# Patient Record
Sex: Female | Born: 1995 | Race: Black or African American | Hispanic: No | Marital: Single | Smoking: Current some day smoker
Health system: Southern US, Community
[De-identification: ages and names within clinical notes are randomized; demographics above are authoritative.]

## PROBLEM LIST (undated history)

## (undated) HISTORY — PX: ADENOIDECTOMY: SUR15

## (undated) HISTORY — PX: TONSILLECTOMY: SUR1361

---

## 2016-08-17 ENCOUNTER — Emergency Department (HOSPITAL_COMMUNITY): Payer: Medicaid Other

## 2016-08-17 ENCOUNTER — Emergency Department (HOSPITAL_COMMUNITY)
Admission: EM | Admit: 2016-08-17 | Discharge: 2016-08-17 | Disposition: A | Discharge status: Home / Self Care | Payer: Medicaid Other | Attending: Emergency Medicine | Admitting: Emergency Medicine

## 2016-08-17 ENCOUNTER — Encounter (HOSPITAL_COMMUNITY): Payer: Self-pay | Admitting: Emergency Medicine

## 2016-08-17 DIAGNOSIS — R6889 Other general symptoms and signs: Secondary | ICD-10-CM

## 2016-08-17 DIAGNOSIS — Z79899 Other long term (current) drug therapy: Secondary | ICD-10-CM | POA: Diagnosis not present

## 2016-08-17 DIAGNOSIS — R52 Pain, unspecified: Secondary | ICD-10-CM | POA: Diagnosis not present

## 2016-08-17 DIAGNOSIS — R05 Cough: Secondary | ICD-10-CM | POA: Insufficient documentation

## 2016-08-17 DIAGNOSIS — F17210 Nicotine dependence, cigarettes, uncomplicated: Secondary | ICD-10-CM | POA: Insufficient documentation

## 2016-08-17 DIAGNOSIS — R509 Fever, unspecified: Secondary | ICD-10-CM | POA: Insufficient documentation

## 2016-08-17 LAB — CBC
HEMATOCRIT: 36.7 % (ref 36.0–46.0)
HEMOGLOBIN: 12.5 g/dL (ref 12.0–15.0)
MCH: 28.3 pg (ref 26.0–34.0)
MCHC: 34.1 g/dL (ref 30.0–36.0)
MCV: 83.2 fL (ref 78.0–100.0)
Platelets: 191 10*3/uL (ref 150–400)
RBC: 4.41 MIL/uL (ref 3.87–5.11)
RDW: 12.8 % (ref 11.5–15.5)
WBC: 5.4 10*3/uL (ref 4.0–10.5)

## 2016-08-17 LAB — COMPREHENSIVE METABOLIC PANEL
ALBUMIN: 4.1 g/dL (ref 3.5–5.0)
ALT: 21 U/L (ref 14–54)
ANION GAP: 10 (ref 5–15)
AST: 32 U/L (ref 15–41)
Alkaline Phosphatase: 55 U/L (ref 38–126)
BUN: 12 mg/dL (ref 6–20)
CO2: 20 mmol/L — AB (ref 22–32)
Calcium: 8.9 mg/dL (ref 8.9–10.3)
Chloride: 107 mmol/L (ref 101–111)
Creatinine, Ser: 0.89 mg/dL (ref 0.44–1.00)
GFR calc Af Amer: >60 mL/min (ref >60)
GFR calc non Af Amer: >60 mL/min (ref >60)
GLUCOSE: 85 mg/dL (ref 65–99)
POTASSIUM: 2.9 mmol/L — AB (ref 3.5–5.1)
SODIUM: 137 mmol/L (ref 135–145)
TOTAL PROTEIN: 7.3 g/dL (ref 6.5–8.1)
Total Bilirubin: 0.7 mg/dL (ref 0.3–1.2)

## 2016-08-17 LAB — URINALYSIS, ROUTINE W REFLEX MICROSCOPIC
BILIRUBIN URINE: NEGATIVE
Glucose, UA: NEGATIVE mg/dL
HGB URINE DIPSTICK: NEGATIVE
KETONES UR: 80 mg/dL — AB
Leukocytes, UA: NEGATIVE
NITRITE: NEGATIVE
PROTEIN: 30 mg/dL — AB
Specific Gravity, Urine: 1.031 — ABNORMAL HIGH (ref 1.005–1.030)
pH: 5 (ref 5.0–8.0)

## 2016-08-17 LAB — PREGNANCY, URINE: Preg Test, Ur: NEGATIVE

## 2016-08-17 LAB — LIPASE, BLOOD: Lipase: 31 U/L (ref 11–51)

## 2016-08-17 LAB — I-STAT BETA HCG BLOOD, ED (MC, WL, AP ONLY)

## 2016-08-17 MED ORDER — IBUPROFEN 800 MG PO TABS: 800.0000 mg | ORAL_TABLET | Freq: Three times a day (TID) | ORAL | 0 refills | Status: DC

## 2016-08-17 MED ORDER — POTASSIUM CHLORIDE CRYS ER 20 MEQ PO TBCR
60.0000 meq | EXTENDED_RELEASE_TABLET | Freq: Once | ORAL | Status: AC
  Administered 2016-08-17: 60 meq via ORAL
  Filled 2016-08-17: qty 3

## 2016-08-17 MED ORDER — SODIUM CHLORIDE 0.9 % IV BOLUS (SEPSIS)
1000.0000 mL | Freq: Once | INTRAVENOUS | Status: AC
  Administered 2016-08-17: 1000 mL via INTRAVENOUS

## 2016-08-17 MED ORDER — ONDANSETRON HCL 4 MG PO TABS: 4.0000 mg | ORAL_TABLET | Freq: Four times a day (QID) | ORAL | 0 refills | Status: DC

## 2016-08-17 MED ORDER — ONDANSETRON HCL 4 MG/2ML IJ SOLN
4.0000 mg | Freq: Once | INTRAMUSCULAR | Status: AC
  Administered 2016-08-17: 4 mg via INTRAVENOUS
  Filled 2016-08-17: qty 2

## 2016-08-17 MED ORDER — CETIRIZINE-PSEUDOEPHEDRINE ER 5-120 MG PO TB12: 1.0000 | ORAL_TABLET | Freq: Two times a day (BID) | ORAL | 0 refills | Status: DC

## 2016-08-17 MED ORDER — BENZONATATE 100 MG PO CAPS: 100.0000 mg | ORAL_CAPSULE | Freq: Three times a day (TID) | ORAL | 0 refills | Status: DC

## 2016-08-17 NOTE — ED Notes (Signed)
Pt drinking water for PO challenge

## 2016-08-17 NOTE — ED Triage Notes (Addendum)
Pt verbalizes continued flu like symptoms of chills, fever, chest pain with cough, nausea, diarrhea, and generalized aches for two weeks. Pt denies vomiting.

## 2016-08-17 NOTE — Discharge Instructions (Signed)
Medications: Tessalon, Zyrtec-D, Zofran, ibuprofen  Treatment: Take Tessalon every 8 hours as needed for cough. Take Zyrtec-D twice daily for nasal congestion. Take Zofran every 6 hours as needed for nausea or vomiting. Take ibuprofen every 8 hours for body aches. You can alternate with Tylenol as prescribed over-the-counter for fever and pain as well. Make sure to drink plenty of fluids.  Follow-up: Please return the emergency department if you develop any new or worsening symptoms including feeling like you are going to pass out, intractable vomiting, or any other concerning symptoms.

## 2016-08-17 NOTE — ED Provider Notes (Signed)
[facility]-EMERGENCY DEPT Provider Note   CSN: [id number] Arrival date & time: 08/17/16  1517     History   Chief Complaint Chief Complaint  Patient presents with  . Flu Like Symptoms    HPI Wanda Bryan is a 21 y.o. female who has previously healthy who presents with a four-day history of fever, cough, generalized body aches, diarrhea. Patient states she began 2 weeks ago with a upper respiratory infection and began feeling better, until 4 days ago when she began having fever, chills, nausea, generalized body aches, diarrhea. No bloody stools or vomiting. No abdominal pain. Patient states she has an achy and burning pain in her chest. No shortness of breath. Patient has been taking TheraFlu without significant relief. Patient has been able to drink fluids, but is unable to eat due to nausea. Patient denies any urinary symptoms, no leg pain or swelling, recent long trips, recent surgeries, cancer. Patient uses Mirena for birth control.  HPI  History reviewed. No pertinent past medical history.  There are no active problems to display for this patient.   History reviewed. No pertinent surgical history.  OB History    No data available       Home Medications    Prior to Admission medications   Medication Sig Start Date End Date Taking? Authorizing Provider  benzonatate (TESSALON) 100 MG capsule Take 1 capsule (100 mg total) by mouth every 8 (eight) hours. 08/17/16   Carnetta Losada M Shadara Lopez, PA-C  cetirizine-pseudoephedrine (ZYRTEC-D) 5-120 MG tablet Take 1 tablet by mouth 2 (two) times daily. 08/17/16   Emi HolesAlexandra M [place] Paone, PA-C  ibuprofen (ADVIL,MOTRIN) 800 MG tablet Take 1 tablet (800 mg total) by mouth 3 (three) times daily. 08/17/16   Shanequa Whitenight M Maalle Starrett, PA-C  ondansetron (ZOFRAN) 4 MG tablet Take 1 tablet (4 mg total) by mouth every 6 (six) hours. 08/17/16   Emi HolesAlexandra M Tharon Bomar, PA-C    Family History No family history on file.  Social History Social History  Substance Use Topics  . Smoking  status: Current Some Day Smoker    Packs/day: 0.25    Types: Cigarettes  . Smokeless tobacco: Never Used  . Alcohol use No     Allergies   Penicillins   Review of Systems Review of Systems  Constitutional: Positive for appetite change, chills and fever.  HENT: Positive for congestion and sore throat. Negative for facial swelling.   Respiratory: Positive for cough (productive). Negative for shortness of breath.   Cardiovascular: Positive for chest pain.  Gastrointestinal: Positive for diarrhea and nausea. Negative for abdominal pain, blood in stool and vomiting.  Genitourinary: Negative for dysuria.  Musculoskeletal: Positive for myalgias. Negative for back pain.  Skin: Negative for rash and wound.  Neurological: Negative for headaches.  Psychiatric/Behavioral: The patient is not nervous/anxious.      Physical Exam Updated Vital Signs BP 113/57   Pulse 84   Temp 99.8 F (37.7 C) (Oral)   Resp 20   Wt 90.7 kg   LMP 08/16/2016   SpO2 99%   Physical Exam  Constitutional: She appears well-developed and well-nourished. No distress.  HENT:  Head: Normocephalic and atraumatic.  Mouth/Throat: Oropharynx is clear and moist. No oropharyngeal exudate.  Eyes: Conjunctivae are normal. Pupils are equal, round, and reactive to light. Right eye exhibits no discharge. Left eye exhibits no discharge. No scleral icterus.  Neck: Normal range of motion. Neck supple. No thyromegaly present.  Cardiovascular: Normal rate, regular rhythm, normal heart sounds and intact distal pulses.  Exam reveals no gallop and no friction rub.   No murmur heard. Pulmonary/Chest: Effort normal. No stridor. No respiratory distress. She has no wheezes. She has rales (subtle at bases).  Abdominal: Soft. Bowel sounds are normal. She exhibits no distension. There is no tenderness. There is no rebound and no guarding.  Musculoskeletal: She exhibits no edema.  Lymphadenopathy:    She has no cervical adenopathy.    Neurological: She is alert. Coordination normal.  Skin: Skin is warm and dry. No rash noted. She is not diaphoretic. No pallor.  Psychiatric: She has a normal mood and affect.  Nursing note and vitals reviewed.    ED Treatments / Results  Labs (all labs ordered are listed, but only abnormal results are displayed) Labs Reviewed  COMPREHENSIVE METABOLIC PANEL - Abnormal; Notable for the following:       Result Value   Potassium 2.9 (*)    CO2 20 (*)    All other components within normal limits  URINALYSIS, ROUTINE W REFLEX MICROSCOPIC - Abnormal; Notable for the following:    Specific Gravity, Urine 1.031 (*)    Ketones, ur 80 (*)    Protein, ur 30 (*)    Bacteria, UA RARE (*)    Squamous Epithelial / LPF 0-5 (*)    All other components within normal limits  LIPASE, BLOOD  CBC  PREGNANCY, URINE  I-STAT BETA HCG BLOOD, ED ([facility], [facility], [facility] ONLY)    EKG  EKG Interpretation  Date/Time:  Saturday August 17 2016 17:18:11 EST Ventricular Rate:  89 PR Interval:    QRS Duration: 85 QT Interval:  341 QTC Calculation: 415 R Axis:   80 Text Interpretation:  Sinus rhythm Normal ECG Confirmed by Denton Lank  MD, Caryn Bee ([id number]) on 08/17/2016 6:26:13 PM       Radiology Dg Chest 2 View  Result Date: 08/17/2016 CLINICAL DATA:  Cough x 4 days; no known cardiopulmonary problems; ex smoker EXAM: CHEST  2 VIEW COMPARISON:  None. FINDINGS: Lateral view degraded by patient arm position. Midline trachea. Normal heart size and mediastinal contours. No pleural effusion or pneumothorax. Clear lungs. IMPRESSION: No active cardiopulmonary disease. Electronically Signed   By: Jeronimo Greaves M.D.   On: 08/17/2016 17:31    Procedures Procedures (including critical care time)  Medications Ordered in ED Medications  sodium chloride 0.9 % bolus 1,000 mL (0 mLs Intravenous Stopped 08/17/16 1830)  ondansetron (ZOFRAN) injection 4 mg (4 mg Intravenous Given 08/17/16 1830)  potassium chloride SA (K-DUR,KLOR-CON) CR  tablet 60 mEq (60 mEq Oral Given 08/17/16 1825)  sodium chloride 0.9 % bolus 1,000 mL (1,000 mLs Intravenous New Bag/Given 08/17/16 1830)     Initial Impression / Assessment and Plan / ED Course  I have reviewed the triage vital signs and the nursing notes.  Pertinent labs & imaging results that were available during my care of the patient were reviewed by me and considered in my medical decision making (see chart for details).     CBC unremarkable. CMP shows potassium 2.9, replaced in the ED. Lipase 31. Pregnancy negative. UA shows 80 ketones, 30 protein, specific gravity 1.031. EKG shows NSR. CXR shows no active cardiopulmonary disease. Patient given 2 L of fluid in the ED. Vitals stable. Symptoms are consistent with flu. Patient out of the window for Tamiflu treatment. Will treat symptomatically with cough suppressant, Zyrtec-D, Motrin/Tylenol, Zofran. I encouraged good fluid intake. Return precautions discussed. Patient understands and agrees with plan. Patient vitals stable throughout ED course and discharged  in satisfactory condition.  Final Clinical Impressions(s) / ED Diagnoses   Final diagnoses:  Influenza-like symptoms    New Prescriptions New Prescriptions   BENZONATATE (TESSALON) 100 MG CAPSULE    Take 1 capsule (100 mg total) by mouth every 8 (eight) hours.   CETIRIZINE-PSEUDOEPHEDRINE (ZYRTEC-D) 5-120 MG TABLET    Take 1 tablet by mouth 2 (two) times daily.   IBUPROFEN (ADVIL,MOTRIN) 800 MG TABLET    Take 1 tablet (800 mg total) by mouth 3 (three) times daily.   ONDANSETRON (ZOFRAN) 4 MG TABLET    Take 1 tablet (4 mg total) by mouth every 6 (six) hours.     Emi Holes, PA-C 08/17/16 1955    Linwood Dibbles, MD 08/18/16 [phone]

## 2017-04-26 ENCOUNTER — Emergency Department (HOSPITAL_COMMUNITY)
Admission: EM | Admit: 2017-04-26 | Discharge: 2017-04-27 | Disposition: A | Discharge status: Home / Self Care | Payer: Self-pay | Attending: Emergency Medicine | Admitting: Emergency Medicine

## 2017-04-26 ENCOUNTER — Encounter (HOSPITAL_COMMUNITY): Payer: Self-pay

## 2017-04-26 DIAGNOSIS — R102 Pelvic and perineal pain: Secondary | ICD-10-CM

## 2017-04-26 DIAGNOSIS — N390 Urinary tract infection, site not specified: Secondary | ICD-10-CM | POA: Insufficient documentation

## 2017-04-26 DIAGNOSIS — J302 Other seasonal allergic rhinitis: Secondary | ICD-10-CM | POA: Insufficient documentation

## 2017-04-26 DIAGNOSIS — F17210 Nicotine dependence, cigarettes, uncomplicated: Secondary | ICD-10-CM | POA: Insufficient documentation

## 2017-04-26 LAB — URINALYSIS, ROUTINE W REFLEX MICROSCOPIC
Bilirubin Urine: NEGATIVE
Glucose, UA: NEGATIVE mg/dL
Ketones, ur: NEGATIVE mg/dL
Nitrite: NEGATIVE
Protein, ur: 100 mg/dL — AB
Specific Gravity, Urine: 1.02 (ref 1.005–1.030)
pH: 8 (ref 5.0–8.0)

## 2017-04-26 LAB — COMPREHENSIVE METABOLIC PANEL
ALT: 14 U/L (ref 14–54)
AST: 21 U/L (ref 15–41)
Albumin: 4.3 g/dL (ref 3.5–5.0)
Alkaline Phosphatase: 75 U/L (ref 38–126)
Anion gap: 8 (ref 5–15)
BUN: 11 mg/dL (ref 6–20)
CO2: 24 mmol/L (ref 22–32)
Calcium: 9.8 mg/dL (ref 8.9–10.3)
Chloride: 111 mmol/L (ref 101–111)
Creatinine, Ser: 0.99 mg/dL (ref 0.44–1.00)
GFR calc Af Amer: >60 mL/min (ref >60)
GFR calc non Af Amer: >60 mL/min (ref >60)
Glucose, Bld: 114 mg/dL — ABNORMAL HIGH (ref 65–99)
Potassium: 4.3 mmol/L (ref 3.5–5.1)
Sodium: 143 mmol/L (ref 135–145)
Total Bilirubin: 0.4 mg/dL (ref 0.3–1.2)
Total Protein: 7.5 g/dL (ref 6.5–8.1)

## 2017-04-26 LAB — LIPASE, BLOOD: Lipase: 34 U/L (ref 11–51)

## 2017-04-26 LAB — CBC
HCT: 39.5 % (ref 36.0–46.0)
Hemoglobin: 13.2 g/dL (ref 12.0–15.0)
MCH: 28.3 pg (ref 26.0–34.0)
MCHC: 33.4 g/dL (ref 30.0–36.0)
MCV: 84.6 fL (ref 78.0–100.0)
Platelets: 237 10*3/uL (ref 150–400)
RBC: 4.67 MIL/uL (ref 3.87–5.11)
RDW: 13.5 % (ref 11.5–15.5)
WBC: 5.6 10*3/uL (ref 4.0–10.5)

## 2017-04-26 LAB — POC URINE PREG, ED: Preg Test, Ur: NEGATIVE

## 2017-04-26 NOTE — ED Triage Notes (Signed)
States for about 2 days now lower abdominal pain with dysuria with diarrhea.  bil ear pain states ears are popping.

## 2017-04-27 MED ORDER — SULFAMETHOXAZOLE-TRIMETHOPRIM 800-160 MG PO TABS: 1.0000 | ORAL_TABLET | Freq: Two times a day (BID) | ORAL | 0 refills | Status: AC

## 2017-04-27 MED ORDER — FLUCONAZOLE 200 MG PO TABS: 200.0000 mg | ORAL_TABLET | ORAL | 0 refills | Status: AC

## 2017-04-27 MED ORDER — CETIRIZINE HCL 10 MG PO TABS: 10.0000 mg | ORAL_TABLET | Freq: Every day | ORAL | 1 refills | Status: AC

## 2017-04-27 MED ORDER — FLUTICASONE PROPIONATE 50 MCG/ACT NA SUSP: 2.0000 | Freq: Every day | NASAL | 2 refills | Status: DC

## 2017-04-27 MED ORDER — PHENAZOPYRIDINE HCL 200 MG PO TABS: 200.0000 mg | ORAL_TABLET | Freq: Three times a day (TID) | ORAL | 0 refills | Status: DC | PRN

## 2017-04-27 NOTE — ED Provider Notes (Signed)
[facility]-EMERGENCY DEPT Provider Note   CSN: [id number] Arrival date & time: 04/26/17  1842     History   Chief Complaint Chief Complaint  Patient presents with  . Abdominal Pain    HPI Wanda Bryan is a 21 y.o. female with no major medical problems presents to the Emergency Department complaining of gradual, persistent, progressively worsening lower abdominal pain, dysuria, urinary frequency and urgency onset 2 days ago.  Patient reports she is sexually active with one female partner onset approximately 2 months ago.  She denies vaginal discharge.  Patient also denies risk of STDs.  She reports a previous urinary tract infection several months ago however the urgent care "did not give her the right antibiotic" because she did not get better.  Patient does report that her symptoms did resolve within approximately 1 week.  Patient also reports that she developed yeast infection secondary to her antibiotic usage at that time.  Additionally patient is complaining of nasal congestion, clear rhinorrhea, postnasal drip, sneezing and bilateral otalgia.  No treatments prior to arrival.  She denies fevers, chills, cough.  Patient denies known sick contacts.  She has taken over-the-counter cold medication with minimal relief.  She has not attempted any allergy medications.  She denies vision changes, sinus pain.   The history is provided by the patient and medical records. No language interpreter was used.    History reviewed. No pertinent past medical history.  There are no active problems to display for this patient.   History reviewed. No pertinent surgical history.  OB History    No data available       Home Medications    Prior to Admission medications   Medication Sig Start Date End Date Taking? Authorizing Provider  benzonatate (TESSALON) 100 MG capsule Take 1 capsule (100 mg total) by mouth every 8 (eight) hours. 08/17/16   Law, Waylan Boga, PA-C    cetirizine (ZYRTEC ALLERGY) 10 MG tablet Take 1 tablet (10 mg total) daily by mouth. 04/27/17   Domini Vandehei, Dahlia Client, PA-C  cetirizine-pseudoephedrine (ZYRTEC-D) 5-120 MG tablet Take 1 tablet by mouth 2 (two) times daily. 08/17/16   Law, Waylan Boga, PA-C  fluconazole (DIFLUCAN) 200 MG tablet Take 1 tablet (200 mg total) every 3 (three) days for 2 doses by mouth. 04/27/17 05/01/17  Dulcie Gammon, Dahlia Client, PA-C  fluticasone (FLONASE) 50 MCG/ACT nasal spray Place 2 sprays daily into both nostrils. 04/27/17   Derionna Salvador, Dahlia Client, PA-C  ibuprofen (ADVIL,MOTRIN) 800 MG tablet Take 1 tablet (800 mg total) by mouth 3 (three) times daily. 08/17/16   Law, Waylan Boga, PA-C  ondansetron (ZOFRAN) 4 MG tablet Take 1 tablet (4 mg total) by mouth every 6 (six) hours. 08/17/16   Law, Waylan Boga, PA-C  phenazopyridine (PYRIDIUM) 200 MG tablet Take 1 tablet (200 mg total) 3 (three) times daily as needed by mouth for pain. 04/27/17   Innocence Schlotzhauer, Dahlia Client, PA-C  sulfamethoxazole-trimethoprim (BACTRIM DS,SEPTRA DS) 800-160 MG tablet Take 1 tablet 2 (two) times daily for 7 days by mouth. 04/27/17 05/04/17  Gessica Jawad, Dahlia Client, PA-C    Family History History reviewed. No pertinent family history.  Social History Social History   Tobacco Use  . Smoking status: Current Some Day Smoker    Packs/day: 0.25    Types: Cigarettes  . Smokeless tobacco: Never Used  Substance Use Topics  . Alcohol use: No  . Drug use: No     Allergies   Penicillins   Review of Systems Review of Systems  Constitutional:  Negative for appetite change, diaphoresis, fatigue, fever and unexpected weight change.  HENT: Positive for congestion, ear pain, postnasal drip, rhinorrhea and sore throat. Negative for mouth sores, sinus pressure and sinus pain.   Eyes: Negative for visual disturbance.  Respiratory: Negative for cough, chest tightness, shortness of breath and wheezing.   Cardiovascular: Negative for chest pain.  Gastrointestinal:  Positive for abdominal pain ( Suprapubic). Negative for constipation, diarrhea, nausea and vomiting.  Endocrine: Negative for polydipsia, polyphagia and polyuria.  Genitourinary: Positive for dysuria, frequency and urgency. Negative for hematuria, vaginal bleeding, vaginal discharge and vaginal pain.  Musculoskeletal: Negative for back pain and neck stiffness.  Skin: Negative for rash.  Allergic/Immunologic: Negative for immunocompromised state.  Neurological: Negative for syncope, light-headedness and headaches.  Hematological: Does not bruise/bleed easily.  Psychiatric/Behavioral: Negative for sleep disturbance. The patient is not nervous/anxious.      Physical Exam Updated Vital Signs BP 139/68 (BP Location: Right Arm)   Pulse 70   Temp 98.5 F (36.9 C) (Oral)   Resp 18   Ht 5\' 3"  (1.6 m)   Wt 90.7 kg (200 lb)   SpO2 100%   BMI 35.43 kg/m   Physical Exam  Constitutional: She appears well-developed and well-nourished. No distress.  Awake, alert, nontoxic appearance  HENT:  Head: Normocephalic and atraumatic.  Right Ear: Tympanic membrane, external ear and ear canal normal.  Left Ear: Tympanic membrane, external ear and ear canal normal.  Nose: Mucosal edema and rhinorrhea present. No epistaxis. Right sinus exhibits no maxillary sinus tenderness and no frontal sinus tenderness. Left sinus exhibits no maxillary sinus tenderness and no frontal sinus tenderness.  Mouth/Throat: Uvula is midline, oropharynx is clear and moist and mucous membranes are normal. Mucous membranes are not pale and not cyanotic. No oropharyngeal exudate, posterior oropharyngeal edema, posterior oropharyngeal erythema or tonsillar abscesses.  Eyes: Conjunctivae are normal. Pupils are equal, round, and reactive to light. No scleral icterus.  Neck: Normal range of motion and full passive range of motion without pain. Neck supple.  Cardiovascular: Normal rate, regular rhythm and intact distal pulses.    Pulmonary/Chest: Effort normal and breath sounds normal. No stridor. No respiratory distress. She has no wheezes.  Clear and equal breath sounds without focal wheezes, rhonchi, rales  Abdominal: Soft. Bowel sounds are normal. She exhibits no mass. There is tenderness ( Very mild) in the suprapubic area. There is no rigidity, no rebound, no guarding, no CVA tenderness, no tenderness at McBurney's point and negative Murphy's sign.  Musculoskeletal: Normal range of motion. She exhibits no edema.  Lymphadenopathy:    She has no cervical adenopathy.  Neurological: She is alert.  Speech is clear and goal oriented Moves extremities without ataxia  Skin: Skin is warm and dry. No rash noted. She is not diaphoretic.  Psychiatric: She has a normal mood and affect.  Nursing note and vitals reviewed.    ED Treatments / Results  Labs (all labs ordered are listed, but only abnormal results are displayed) Labs Reviewed  COMPREHENSIVE METABOLIC PANEL - Abnormal; Notable for the following components:      Result Value   Glucose, Bld 114 (*)    All other components within normal limits  URINALYSIS, ROUTINE W REFLEX MICROSCOPIC - Abnormal; Notable for the following components:   APPearance CLOUDY (*)    Hgb urine dipstick MODERATE (*)    Protein, ur 100 (*)    Leukocytes, UA LARGE (*)    Bacteria, UA RARE (*)    Squamous  Epithelial / LPF 0-5 (*)    All other components within normal limits  URINE CULTURE  LIPASE, BLOOD  CBC  POC URINE PREG, ED     Procedures Procedures (including critical care time)  Medications Ordered in ED Medications - No data to display   Initial Impression / Assessment and Plan / ED Course  I have reviewed the triage vital signs and the nursing notes.  Pertinent labs & imaging results that were available during my care of the patient were reviewed by me and considered in my medical decision making (see chart for details).     Pt has been diagnosed with a UTI.  Pt is afebrile, no CVA tenderness, normotensive, and denies N/V. Pt to be dc home with antibiotics and instructions to follow up with PCP if symptoms persist.  Due to previous urinary tract infection, culture was sent.  Because concern about the potential STDs from a new sexual partner and contaminated urine however less likely.  Patient reports she will see her primary care doctor for STD evaluation.  She does not wish for pelvic exam or STD testing today.  Discussed reasons to return immediately to the emergency department including worsening pain, vomiting, back pain, fevers or other concerns.  Patient states understanding and is in agreement with the plan.  Patient upper respiratory symptoms likely secondary to seasonal allergies.  Will give Flonase and Zyrtec.  No evidence of pneumonia or acute sinusitis.  Discussed that antibiotics are not indicated at this time. Pt will be discharged with symptomatic treatment.  Pt is hemodynamically stable & in NAD prior to dc.    Final Clinical Impressions(s) / ED Diagnoses   Final diagnoses:  Lower urinary tract infectious disease  Suprapubic abdominal pain  Seasonal allergic rhinitis, unspecified trigger    ED Discharge Orders        Ordered    fluconazole (DIFLUCAN) 200 MG tablet  every 72 hours     04/27/17 0008    sulfamethoxazole-trimethoprim (BACTRIM DS,SEPTRA DS) 800-160 MG tablet  2 times daily     04/27/17 0008    fluticasone (FLONASE) 50 MCG/ACT nasal spray  Daily     04/27/17 0008    cetirizine (ZYRTEC ALLERGY) 10 MG tablet  Daily     04/27/17 0008    phenazopyridine (PYRIDIUM) 200 MG tablet  3 times daily PRN     04/27/17 0009       Tyshia Fenter, Dahlia ClientHannah, PA-C 04/27/17 0017    Raeford RazorKohut, Stephen, MD 04/29/17 1238

## 2017-04-27 NOTE — Discharge Instructions (Signed)
1. Medications: Bactrim, Pyridium, Flonase, Zyrtec, fluconazole, usual home medications 2. Treatment: rest, drink plenty of fluids, take medications as prescribed 3. Follow Up: Please followup with your primary doctor in 3 days for discussion of your diagnoses and further evaluation after today's visit; if you do not have a primary care doctor use the resource guide provided to find one; return to the ER for fevers, persistent vomiting, worsening abdominal pain or other concerning symptoms.

## 2018-04-28 ENCOUNTER — Other Ambulatory Visit: Payer: Self-pay | Admitting: Family Medicine

## 2018-04-28 ENCOUNTER — Ambulatory Visit: Payer: Self-pay

## 2018-04-28 DIAGNOSIS — M542 Cervicalgia: Secondary | ICD-10-CM

## 2019-04-20 IMAGING — DX DG CERVICAL SPINE COMPLETE 4+V
5 series · 5 of 5 positions shown · non-contrast
Comparison: None.

CLINICAL DATA: Neck pain

EXAM:
CERVICAL SPINE - COMPLETE 4+ VIEW

[c-spine lat]
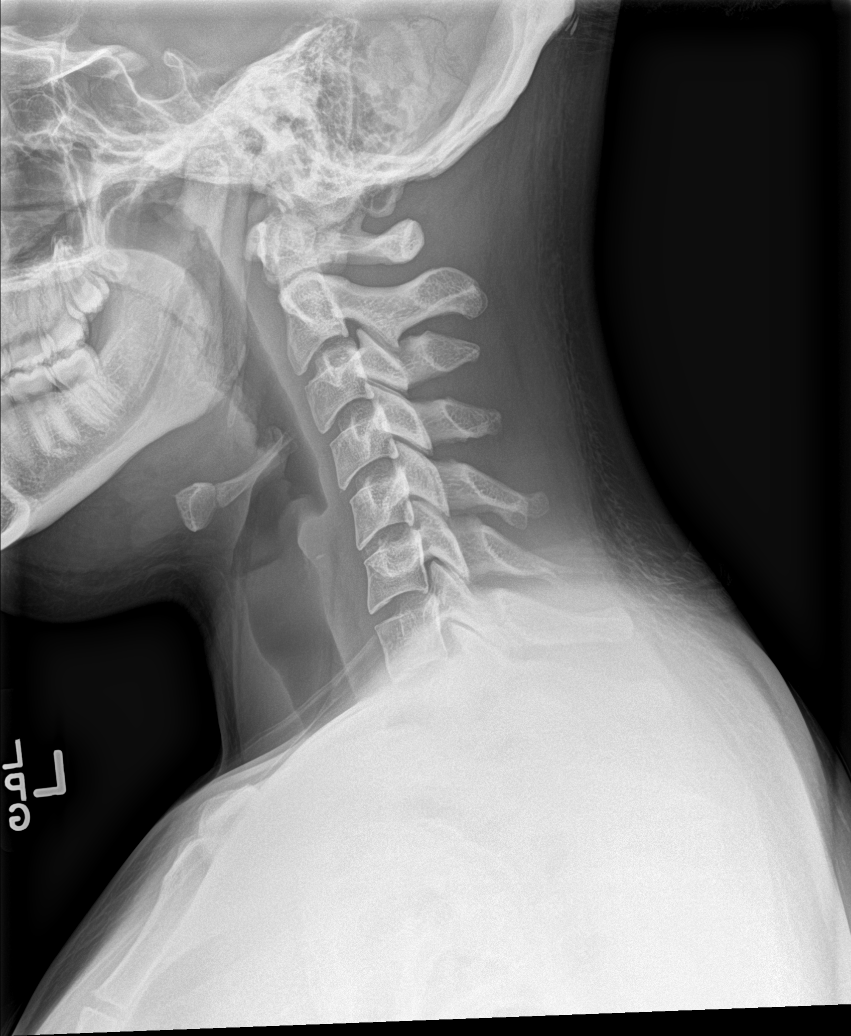

[c-spine obl (1 of 2)]
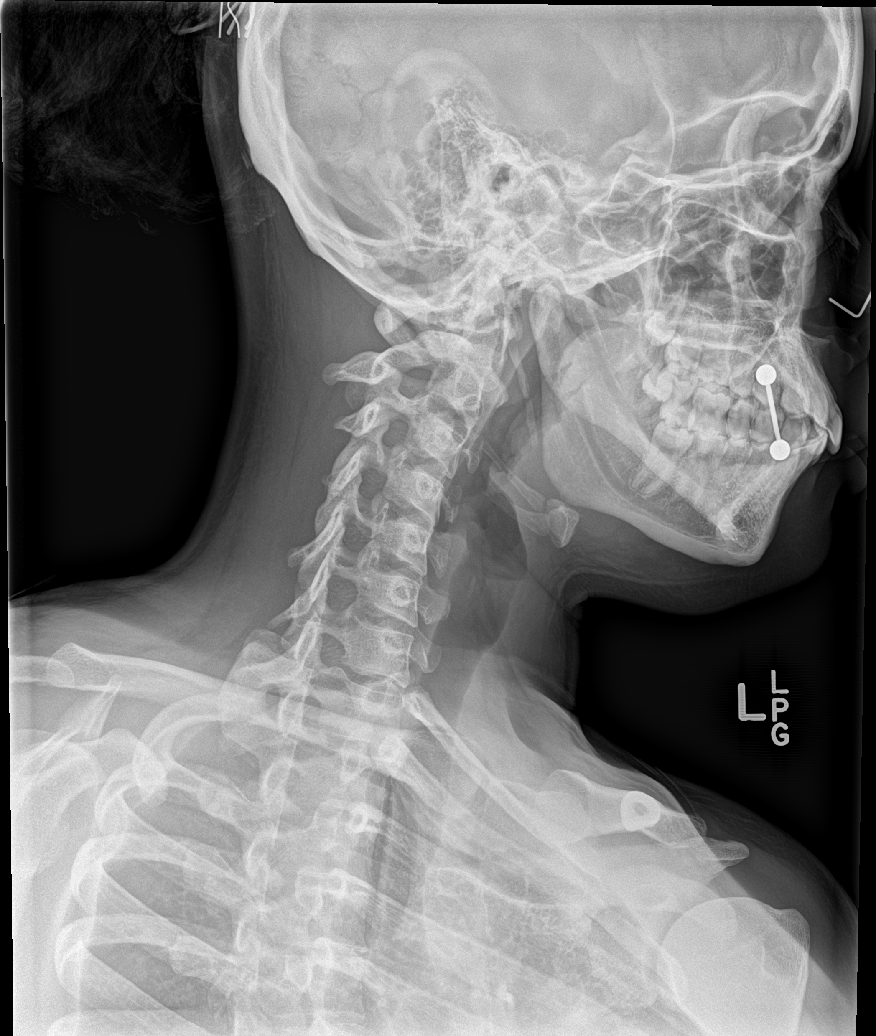

[c-spine obl (2 of 2)]
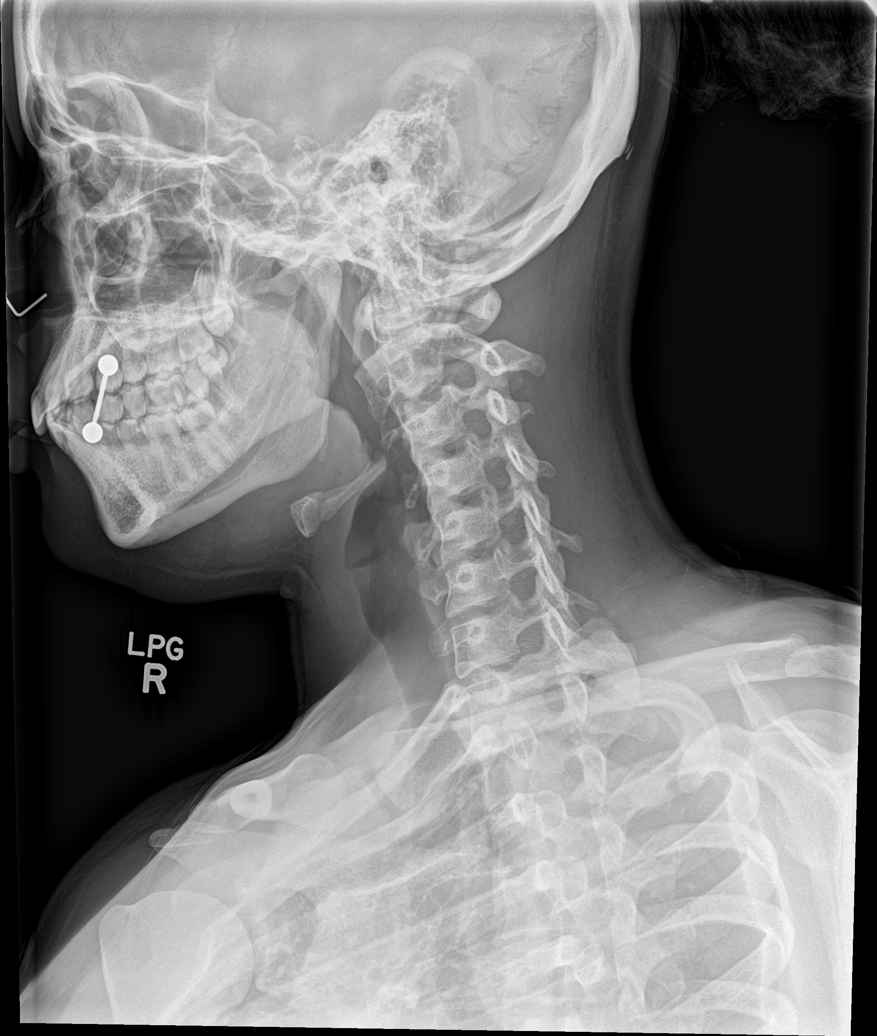

[c-spine ap]
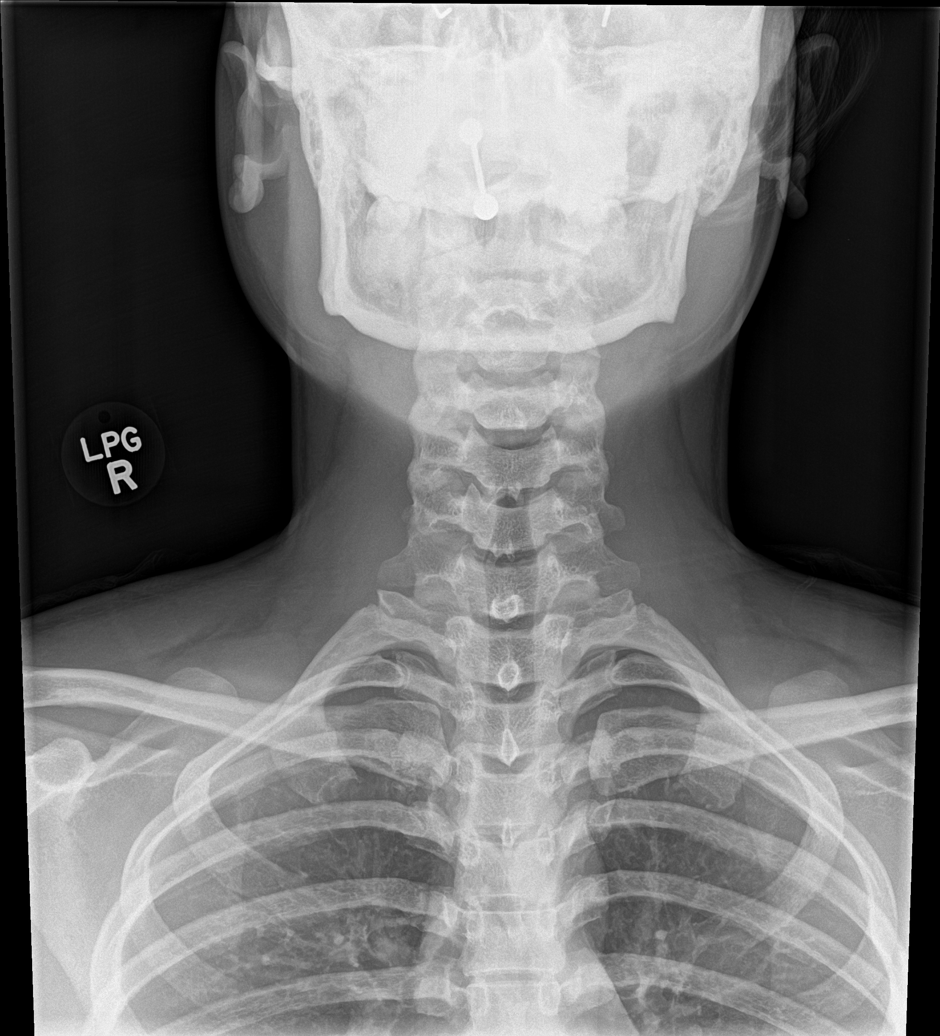

[c-spine open mouth]
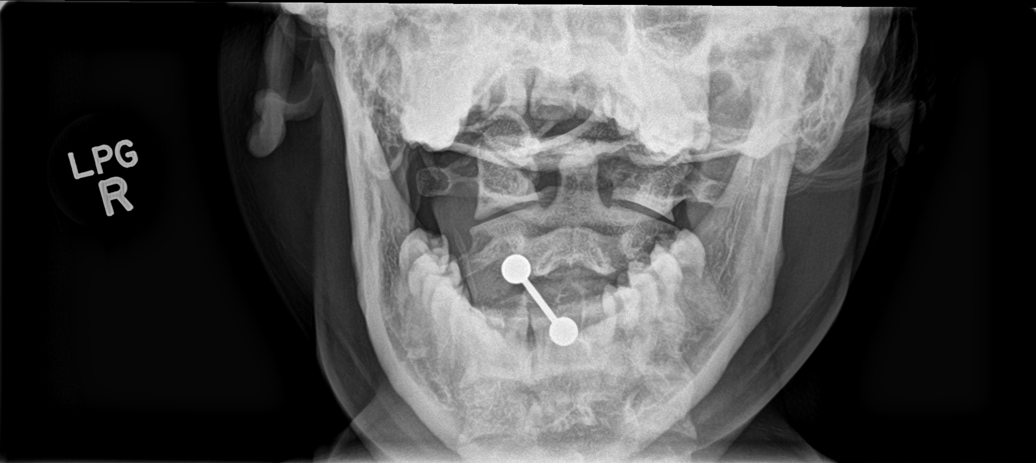

[5 of 5 positions shown; findings below may reference images not displayed]

FINDINGS: There is no evidence of cervical spine fracture or prevertebral soft
tissue swelling. There is slight reversal cervical lordosis. No
significant cervical neural foraminal encroachment on the oblique
views. No jumped or perched appearing facets. Included lung apices
are unremarkable. Metallic tongue piercing is noted on some of the
views.
IMPRESSION: No acute cervical spine fracture. Slight reversal of cervical
lordosis may be due to patient position or can be seen with muscle
spasm.

## 2021-09-23 ENCOUNTER — Ambulatory Visit
Admission: EM | Admit: 2021-09-23 | Discharge: 2021-09-23 | Disposition: A | Discharge status: Home / Self Care | Payer: Medicaid Other | Attending: Emergency Medicine | Admitting: Emergency Medicine

## 2021-09-23 DIAGNOSIS — Z79899 Other long term (current) drug therapy: Secondary | ICD-10-CM | POA: Insufficient documentation

## 2021-09-23 DIAGNOSIS — B309 Viral conjunctivitis, unspecified: Secondary | ICD-10-CM | POA: Insufficient documentation

## 2021-09-23 DIAGNOSIS — J029 Acute pharyngitis, unspecified: Secondary | ICD-10-CM | POA: Insufficient documentation

## 2021-09-23 DIAGNOSIS — Z20822 Contact with and (suspected) exposure to covid-19: Secondary | ICD-10-CM | POA: Insufficient documentation

## 2021-09-23 DIAGNOSIS — J069 Acute upper respiratory infection, unspecified: Secondary | ICD-10-CM | POA: Insufficient documentation

## 2021-09-23 LAB — GROUP A STREP BY PCR: Group A Strep by PCR: NOT DETECTED

## 2021-09-23 MED ORDER — IPRATROPIUM BROMIDE 0.06 % NA SOLN: 2.0000 | Freq: Four times a day (QID) | NASAL | 12 refills | Status: AC

## 2021-09-23 MED ORDER — OPCON-A 0.027-0.315 % OP SOLN: 2.0000 [drp] | Freq: Two times a day (BID) | OPHTHALMIC | 0 refills | Status: AC

## 2021-09-23 NOTE — Discharge Instructions (Addendum)
Isolate at home pending the results of your COVID test.  If you test positive then you will have to quarantine for 5 days from the start of your symptoms.  After 5 days you can break quarantine if your symptoms have improved and you have not had a fever for 24 hours without taking Tylenol or ibuprofen. ? ?Use over-the-counter Tylenol and ibuprofen as needed for body aches and fever. ? ?Use the Atrovent nasal spray, 2 squirts up each nostril every 6 hours, as needed for nasal congestion runny nose. ? ?Gargle with warm salt water 2-3 times a day to help alleviate the postnasal drip which is causing her throat to be irritated. ? ?Use Opcon-A, 2 drops in your right eye twice daily to help with the redness and discharge.  I suspect that your inflammation is viral in nature. ? ?If you develop any increased shortness of breath-especially at rest, you are unable to speak in full sentences, or is a late sign your lips are turning blue you need to go the ER for evaluation.  ?

## 2021-09-23 NOTE — ED Triage Notes (Signed)
Pt presents with sore throat, bilateral ear pain, body aches (specifically upper back) x a few days.  Thought she was getting better but feeling worse this morning.  ? ?Reports R eye drainage, crusting, redness x today.  Reports blurred vision and photosensitivity.  ?

## 2021-09-23 NOTE — ED Provider Notes (Signed)
?[facility] ? ? ? ?CSN: [id number] ?Arrival date & time: 09/23/21  1132 ? ? ?  ? ?History   ?Chief Complaint ?Chief Complaint  ?Patient presents with  ? Eye Drainage  ?  R   ? Generalized Body Aches  ? ? ?HPI ?Wanda Bryan is a 26 y.o. female.  ? ?HPI ? ?26 year old female here for evaluation of respiratory complaints. ? ?Patient reports that for last few days she has been experiencing a sore throat, bilateral ear pain, and body aches.  She reports that she has yellow nasal discharge.  This morning she woke up with her right eye itching and crusted shut with a yellow mucousy discharge.  She also has some redness to her right eye.  She denies fever, cough, shortness with wheezing, or GI complaints. ? ?History reviewed. No pertinent past medical history. ? ?There are no problems to display for this patient. ? ? ?Past Surgical History:  ?Procedure Laterality Date  ? ADENOIDECTOMY    ? TONSILLECTOMY    ? ? ?OB History   ?No obstetric history on file. ?  ? ? ? ?Home Medications   ? ?Prior to Admission medications   ?Medication Sig Start Date End Date Taking? Authorizing Provider  ?ipratropium (ATROVENT) 0.06 % nasal spray Place 2 sprays into both nostrils 4 (four) times daily. 09/23/21  Yes Becky Augusta, NP  ?levonorgestrel (MIRENA) 20 MCG/DAY IUD 1 each by Intrauterine route once.   Yes [provider]  ?Naphazoline-Pheniramine (OPCON-A) 0.027-0.315 % SOLN Apply 2 drops to eye 2 (two) times daily. 09/23/21  Yes Becky Augusta, NP  ?cetirizine (ZYRTEC ALLERGY) 10 MG tablet Take 1 tablet (10 mg total) daily by mouth. 04/27/17   Muthersbaugh, Dahlia Client, PA-C  ? ? ?Family History ?Family History  ?Problem Relation Age of Onset  ? Healthy Mother   ? ? ?Social History ?Social History  ? ?Tobacco Use  ? Smoking status: Some Days  ?  Packs/day: 0.25  ?  Types: Cigarettes  ? Smokeless tobacco: Never  ?Vaping Use  ? Vaping Use: Never used  ?Substance Use Topics  ? Alcohol use: No  ? Drug use: No  ? ? ? ?Allergies    ?Penicillins ? ? ?Review of Systems ?Review of Systems  ?Constitutional:  Negative for fever.  ?HENT:  Positive for congestion, ear pain, rhinorrhea and sore throat.   ?Eyes:  Positive for discharge, redness, itching and visual disturbance. Negative for photophobia.  ?Respiratory:  Negative for cough, shortness of breath and wheezing.   ?Gastrointestinal:  Negative for diarrhea, nausea and vomiting.  ?Skin:  Negative for rash.  ?Hematological: Negative.   ?Psychiatric/Behavioral: Negative.    ? ? ?Physical Exam ?Triage Vital Signs ?ED Triage Vitals  ?Enc Vitals Group  ?   BP 09/23/21 1305 111/76  ?   Pulse Rate 09/23/21 1305 67  ?   Resp 09/23/21 1305 18  ?   Temp 09/23/21 1305 98.4 ?F (36.9 ?C)  ?   Temp Source 09/23/21 1305 Oral  ?   SpO2 09/23/21 1305 100 %  ?   Weight --   ?   Height --   ?   Head Circumference --   ?   Peak Flow --   ?   Pain Score 09/23/21 1306 6  ?   Pain Loc --   ?   Pain Edu? --   ?   Excl. in GC? --   ? ?No data found. ? ?Updated Vital Signs ?BP 111/76 (BP  Location: Right Arm)   Pulse 67   Temp 98.4 ?F (36.9 ?C) (Oral)   Resp 16   LMP 09/23/2021 (Exact Date) Comment: mirena  SpO2 100%  ? ?Visual Acuity ?Right Eye Distance: 20/30 ?Left Eye Distance: 20/40 ?Bilateral Distance: 20/40 ? ?Right Eye Near:   ?Left Eye Near:    ?Bilateral Near:    ? ?Physical Exam ?Vitals and nursing note reviewed.  ?Constitutional:   ?   Appearance: Normal appearance. She is not ill-appearing.  ?HENT:  ?   Head: Normocephalic and atraumatic.  ?   Right Ear: Ear canal and external ear normal. There is no impacted cerumen.  ?   Left Ear: Ear canal and external ear normal. There is no impacted cerumen.  ?   Ears:  ?   Comments: Both TMs are mildly erythematous.  No effusion or injection noted. ?   Nose: Congestion and rhinorrhea present.  ?   Mouth/Throat:  ?   Mouth: Mucous membranes are moist.  ?   Pharynx: Oropharynx is clear. Posterior oropharyngeal erythema present. No oropharyngeal exudate.   ?Cardiovascular:  ?   Rate and Rhythm: Normal rate and regular rhythm.  ?   Pulses: Normal pulses.  ?   Heart sounds: Normal heart sounds. No murmur heard. ?  No friction rub. No gallop.  ?Pulmonary:  ?   Effort: Pulmonary effort is normal.  ?   Breath sounds: Normal breath sounds. No wheezing, rhonchi or rales.  ?Musculoskeletal:  ?   Cervical back: Normal range of motion and neck supple.  ?Lymphadenopathy:  ?   Cervical: No cervical adenopathy.  ?Skin: ?   General: Skin is warm and dry.  ?   Capillary Refill: Capillary refill takes less than 2 seconds.  ?   Findings: No erythema or rash.  ?Neurological:  ?   General: No focal deficit present.  ?   Mental Status: She is alert and oriented to person, place, and time.  ?Psychiatric:     ?   Mood and Affect: Mood normal.     ?   Behavior: Behavior normal.     ?   Thought Content: Thought content normal.     ?   Judgment: Judgment normal.  ? ? ? ?UC Treatments / Results  ?Labs ?(all labs ordered are listed, but only abnormal results are displayed) ?Labs Reviewed  ?GROUP A STREP BY PCR  ?SARS CORONAVIRUS 2 (TAT 6-24 HRS)  ? ? ?EKG ? ? ?Radiology ?No results found. ? ?Procedures ?Procedures (including critical care time) ? ?Medications Ordered in UC ?Medications - No data to display ? ?Initial Impression / Assessment and Plan / UC Course  ?I have reviewed the triage vital signs and the nursing notes. ? ?Pertinent labs & imaging results that were available during my care of the patient were reviewed by me and considered in my medical decision making (see chart for details). ? ?Patient is a nontoxic-appearing 26 year old female here for evaluation of respiratory complaints as outlined in the HPI above.  She developed redness, itching, and discharge to her right eye this morning.  Visual acuity shows OD 20/30, OS 20/40, and OU 20/40.  She is supposed to wear glasses but does not have them with her.  The remainder of her physical exam reveals mildly erythematous tympanic  membranes bilaterally.  There is no effusion or injection noted.  Landmarks are well visualized and the external auditory canals are clear.  Nasal mucosa is erythematous and edematous with clear discharge  in both nares.  Oropharyngeal exam reveals posterior oropharyngeal erythema and injection with clear postnasal drip.  Tonsillar pillars are surgically absent.  No cervical lymphadenopathy appreciated on exam.  Cardiopulmonary exam feels clung sounds in all fields.  Strep PCR was collected at triage.  If patient strep is negative I will test her for COVID. ? ?Strep PCR is negative. ? ?I will order a send out COVID test and discharge patient home to isolate pending the results.  I will treat her for viral upper respiratory infection with Atrovent nasal spray to open the nasal congestion.  I suspect that her conjunctivitis may be viral in nature. ? ? ?Final Clinical Impressions(s) / UC Diagnoses  ? ?Final diagnoses:  ?Upper respiratory tract infection, unspecified type  ?Viral conjunctivitis of right eye  ? ? ? ?Discharge Instructions   ? ?  ?Isolate at home pending the results of your COVID test.  If you test positive then you will have to quarantine for 5 days from the start of your symptoms.  After 5 days you can break quarantine if your symptoms have improved and you have not had a fever for 24 hours without taking Tylenol or ibuprofen. ? ?Use over-the-counter Tylenol and ibuprofen as needed for body aches and fever. ? ?Use the Atrovent nasal spray, 2 squirts up each nostril every 6 hours, as needed for nasal congestion runny nose. ? ?Gargle with warm salt water 2-3 times a day to help alleviate the postnasal drip which is causing her throat to be irritated. ? ?Use Opcon-A, 2 drops in your right eye twice daily to help with the redness and discharge.  I suspect that your inflammation is viral in nature. ? ?If you develop any increased shortness of breath-especially at rest, you are unable to speak in full  sentences, or is a late sign your lips are turning blue you need to go the ER for evaluation.  ? ? ? ? ?ED Prescriptions   ? ? Medication Sig Dispense Auth. Provider  ? ipratropium (ATROVENT) 0.06 % nasal spray Place 2 sprays into

## 2021-09-23 NOTE — ED Triage Notes (Signed)
Visual acuity completed.  Pt supposed to wear glasses but does not wear them d/t lack of insurance.  ?

## 2021-09-24 LAB — SARS CORONAVIRUS 2 (TAT 6-24 HRS): SARS Coronavirus 2: NEGATIVE
